# Patient Record
Sex: Male | Born: 1965 | Race: White | Hispanic: No | Marital: Married | Smoking: Never smoker
Health system: Southern US, Community
[De-identification: ages and names within clinical notes are randomized; demographics above are authoritative.]

## PROBLEM LIST (undated history)

## (undated) DIAGNOSIS — J302 Other seasonal allergic rhinitis: Secondary | ICD-10-CM

## (undated) DIAGNOSIS — E119 Type 2 diabetes mellitus without complications: Secondary | ICD-10-CM

## (undated) DIAGNOSIS — G4730 Sleep apnea, unspecified: Secondary | ICD-10-CM

## (undated) DIAGNOSIS — M1990 Unspecified osteoarthritis, unspecified site: Secondary | ICD-10-CM

## (undated) DIAGNOSIS — N189 Chronic kidney disease, unspecified: Secondary | ICD-10-CM

## (undated) HISTORY — PX: OTHER SURGICAL HISTORY: SHX169

## (undated) HISTORY — PX: WISDOM TOOTH EXTRACTION: SHX21

## (undated) HISTORY — PX: COLONOSCOPY: SHX174

## (undated) HISTORY — PX: KNEE ARTHROSCOPY: SUR90

---

## 2012-04-05 HISTORY — PX: ESOPHAGOGASTRODUODENOSCOPY ENDOSCOPY: SHX5814

## 2013-07-09 ENCOUNTER — Other Ambulatory Visit: Payer: Self-pay | Admitting: Neurosurgery

## 2013-07-09 ENCOUNTER — Other Ambulatory Visit (HOSPITAL_COMMUNITY): Payer: Self-pay | Admitting: *Deleted

## 2013-07-09 ENCOUNTER — Encounter (HOSPITAL_COMMUNITY)
Admission: RE | Admit: 2013-07-09 | Discharge: 2013-07-09 | Disposition: A | Discharge status: Home / Self Care | Payer: BC Managed Care – PPO | Source: Ambulatory Visit | Attending: Neurosurgery | Admitting: Neurosurgery

## 2013-07-09 ENCOUNTER — Encounter (HOSPITAL_COMMUNITY): Payer: Self-pay

## 2013-07-09 HISTORY — DX: Type 2 diabetes mellitus without complications: E11.9

## 2013-07-09 HISTORY — DX: Unspecified osteoarthritis, unspecified site: M19.90

## 2013-07-09 HISTORY — DX: Sleep apnea, unspecified: G47.30

## 2013-07-09 HISTORY — DX: Chronic kidney disease, unspecified: N18.9

## 2013-07-09 HISTORY — DX: Other seasonal allergic rhinitis: J30.2

## 2013-07-09 LAB — CBC
HCT: 46.7 % (ref 39.0–52.0)
HEMOGLOBIN: 16.9 g/dL (ref 13.0–17.0)
MCH: 29.3 pg (ref 26.0–34.0)
MCHC: 36.2 g/dL — ABNORMAL HIGH (ref 30.0–36.0)
MCV: 80.9 fL (ref 78.0–100.0)
Platelets: 256 10*3/uL (ref 150–400)
RBC: 5.77 MIL/uL (ref 4.22–5.81)
RDW: 13.2 % (ref 11.5–15.5)
WBC: 10.6 10*3/uL — AB (ref 4.0–10.5)

## 2013-07-09 LAB — BASIC METABOLIC PANEL
BUN: 19 mg/dL (ref 6–23)
CALCIUM: 9.8 mg/dL (ref 8.4–10.5)
CO2: 24 meq/L (ref 19–32)
Chloride: 92 mEq/L — ABNORMAL LOW (ref 96–112)
Creatinine, Ser: 0.8 mg/dL (ref 0.50–1.35)
GFR calc Af Amer: >90 mL/min (ref >90)
GLUCOSE: 264 mg/dL — AB (ref 70–99)
POTASSIUM: 4.8 meq/L (ref 3.7–5.3)
SODIUM: 133 meq/L — AB (ref 137–147)

## 2013-07-09 LAB — SURGICAL PCR SCREEN
MRSA, PCR: NEGATIVE
Staphylococcus aureus: NEGATIVE

## 2013-07-09 NOTE — Progress Notes (Signed)
Anesthesia chart review: Patient is a 48 year old male posted for one level lumbar laminectomy/decompression mic42rodiscectomy tomorrow at 0730.  His PAT appointment was earlier today.  Chart was given to me to review after 4:30 PM.  History includes DM2, OSA, nephrolithiasis, arthritis. He denied known cardiac history.  BMI is 37 consistent with obesity.  PCP is Dr. Rexene EdisonH. Carollee MassedAnthony alexander in [place].  EKG on 07/09/13 showed NSR, septal infarct (age undetermined).  Overall, probably not significantly changed when compared to his prior EKG on 10/10/12 (PCP).  Preoperative labs noted.  He will get a fasting CBG on arrival.   Further evaluation by his assigned anesthesiologist on the day of surgery for clinical correlation.  If he remains asymptomatic from a CV standpoint then I would anticipate that he could proceed as planned.  Velna Ochsllison Decker Cogdell, PA-C [facility]/Anesthesiology Phone [phone] 07/09/2013 5:08 PM

## 2013-07-09 NOTE — Pre-Procedure Instructions (Signed)
Cole Dickson  07/09/2013   Your procedure is scheduled on:  Tuesday, July 10, 2013 at 7:30 AM.   [facility] Admitting Office at 5:30 AM.   Call this number if you have problems the morning of surgery: [phone]   Remember:   Do not eat food or drink liquids after midnight tonight.   Take these medicines the morning of surgery with A SIP OF WATER: Allopurinol  Please bring your CPAP mask and tubing with you in the am.   Do not wear jewelry, make-up or nail polish.  Do not wear lotions, powders, or perfumes. You may wear deodorant.  Men may shave face and neck.  Do not bring valuables to the hospital.  [facility] is not responsible                  for any belongings or valuables.               Contacts, dentures or bridgework may not be worn into surgery.  Leave suitcase in the car. After surgery it may be brought to your room.  For patients admitted to the hospital, discharge time is determined by your                treatment team.             Special Instructions: [place] - Preparing for Surgery  Before surgery, you can play an important role.  Because skin is not sterile, your skin needs to be as free of germs as possible.  You can reduce the number of germs on you skin by washing with CHG (chlorahexidine gluconate) soap before surgery.  CHG is an antiseptic cleaner which kills germs and bonds with the skin to continue killing germs even after washing.  Please DO NOT use if you have an allergy to CHG or antibacterial soaps.  If your skin becomes reddened/irritated stop using the CHG and inform your nurse when you arrive at Short Stay.  Do not shave (including legs and underarms) for at least 48 hours prior to the first CHG shower.  You may shave your face.  Please follow these instructions carefully:   1.  Shower with CHG Soap the night before surgery and the                                morning of Surgery.  2.  If you choose to  wash your hair, wash your hair first as usual with your       normal shampoo.  3.  After you shampoo, rinse your hair and body thoroughly to remove the                      Shampoo.  4.  Use CHG as you would any other liquid soap.  You can apply chg directly       to the skin and wash gently with scrungie or a clean washcloth.  5.  Apply the CHG Soap to your body ONLY FROM THE NECK DOWN.        Do not use on open wounds or open sores.  Avoid contact with your eyes, ears, mouth and genitals (private parts).  Wash genitals (private parts) with your normal soap.  6.  Wash thoroughly, paying special attention to the area where your surgery        will  be performed.  7.  Thoroughly rinse your body with warm water from the neck down.  8.  DO NOT shower/wash with your normal soap after using and rinsing off       the CHG Soap.  9.  Pat yourself dry with a clean towel.            10.  Wear clean pajamas.            11.  Place clean sheets on your bed the night of your first shower and do not        sleep with pets.  Day of Surgery  Do not apply any lotions the morning of surgery.  Please wear clean clothes to the hospital/surgery center.     Please read over the following fact sheets that you were given: Pain Booklet, Coughing and Deep Breathing, MRSA Information and Surgical Site Infection Prevention

## 2013-07-09 NOTE — Progress Notes (Signed)
Pt's PCP is Dr. Rexene EdisonH. Carollee MassedAnthony Alexander in [place]  Pt denies chest pain, sob. States he's never had to be worked up for any cardiac issues.   Pt instructed to bring CPAP mask and tubing day of surgery.

## 2013-07-10 ENCOUNTER — Encounter (HOSPITAL_COMMUNITY)
Admission: RE | Disposition: A | Discharge status: Home / Self Care | Payer: Self-pay | Source: Ambulatory Visit | Attending: Neurosurgery

## 2013-07-10 ENCOUNTER — Ambulatory Visit (HOSPITAL_COMMUNITY): Payer: BC Managed Care – PPO | Admitting: Certified Registered Nurse Anesthetist

## 2013-07-10 ENCOUNTER — Encounter (HOSPITAL_COMMUNITY): Payer: BC Managed Care – PPO | Admitting: Vascular Surgery

## 2013-07-10 ENCOUNTER — Ambulatory Visit (HOSPITAL_COMMUNITY): Payer: BC Managed Care – PPO

## 2013-07-10 ENCOUNTER — Encounter (HOSPITAL_COMMUNITY): Payer: Self-pay | Admitting: Certified Registered Nurse Anesthetist

## 2013-07-10 ENCOUNTER — Ambulatory Visit (HOSPITAL_COMMUNITY)
Admission: RE | Admit: 2013-07-10 | Discharge: 2013-07-10 | Disposition: A | Discharge status: Home / Self Care | Payer: BC Managed Care – PPO | Source: Ambulatory Visit | Attending: Neurosurgery | Admitting: Neurosurgery

## 2013-07-10 DIAGNOSIS — Z01810 Encounter for preprocedural cardiovascular examination: Secondary | ICD-10-CM | POA: Insufficient documentation

## 2013-07-10 DIAGNOSIS — Z01812 Encounter for preprocedural laboratory examination: Secondary | ICD-10-CM | POA: Insufficient documentation

## 2013-07-10 DIAGNOSIS — M129 Arthropathy, unspecified: Secondary | ICD-10-CM | POA: Insufficient documentation

## 2013-07-10 DIAGNOSIS — J301 Allergic rhinitis due to pollen: Secondary | ICD-10-CM | POA: Insufficient documentation

## 2013-07-10 DIAGNOSIS — G4730 Sleep apnea, unspecified: Secondary | ICD-10-CM | POA: Insufficient documentation

## 2013-07-10 DIAGNOSIS — M5126 Other intervertebral disc displacement, lumbar region: Secondary | ICD-10-CM | POA: Diagnosis present

## 2013-07-10 DIAGNOSIS — E119 Type 2 diabetes mellitus without complications: Secondary | ICD-10-CM | POA: Insufficient documentation

## 2013-07-10 DIAGNOSIS — Z87442 Personal history of urinary calculi: Secondary | ICD-10-CM | POA: Insufficient documentation

## 2013-07-10 HISTORY — PX: LUMBAR LAMINECTOMY/DECOMPRESSION MICRODISCECTOMY: SHX5026

## 2013-07-10 LAB — GLUCOSE, CAPILLARY
GLUCOSE-CAPILLARY: 270 mg/dL — AB (ref 70–99)
GLUCOSE-CAPILLARY: 196 mg/dL — AB (ref 70–99)
GLUCOSE-CAPILLARY: 314 mg/dL — AB (ref 70–99)
GLUCOSE-CAPILLARY: 342 mg/dL — AB (ref 70–99)
Glucose-Capillary: 395 mg/dL — ABNORMAL HIGH (ref 70–99)
Glucose-Capillary: 211 mg/dL — ABNORMAL HIGH (ref 70–99)
Glucose-Capillary: 201 mg/dL — ABNORMAL HIGH (ref 70–99)

## 2013-07-10 SURGERY — LUMBAR LAMINECTOMY/DECOMPRESSION MICRODISCECTOMY 1 LEVEL
Anesthesia: General | Site: Back | Laterality: Left

## 2013-07-10 MED ORDER — INSULIN ASPART 100 UNIT/ML ~~LOC~~ SOLN
SUBCUTANEOUS | Status: AC
  Filled 2013-07-10: qty 1

## 2013-07-10 MED ORDER — ALLOPURINOL 300 MG PO TABS
300.0000 mg | ORAL_TABLET | Freq: Every day | ORAL | Status: DC
  Administered 2013-07-10: 300 mg via ORAL
  Filled 2013-07-10: qty 1

## 2013-07-10 MED ORDER — ROCURONIUM BROMIDE 100 MG/10ML IV SOLN
INTRAVENOUS | Status: DC | PRN
  Administered 2013-07-10 (×2): 50 mg via INTRAVENOUS

## 2013-07-10 MED ORDER — GLYCOPYRROLATE 0.2 MG/ML IJ SOLN
INTRAMUSCULAR | Status: AC
  Filled 2013-07-10: qty 4

## 2013-07-10 MED ORDER — PROPOFOL 10 MG/ML IV BOLUS
INTRAVENOUS | Status: AC
  Filled 2013-07-10: qty 20

## 2013-07-10 MED ORDER — SODIUM CHLORIDE 0.9 % IR SOLN
Status: DC | PRN
  Administered 2013-07-10: 07:00:00

## 2013-07-10 MED ORDER — NEOSTIGMINE METHYLSULFATE 1 MG/ML IJ SOLN
INTRAMUSCULAR | Status: DC | PRN
  Administered 2013-07-10: 5 mg via INTRAVENOUS

## 2013-07-10 MED ORDER — LIDOCAINE HCL (CARDIAC) 20 MG/ML IV SOLN
INTRAVENOUS | Status: AC
Start: 2013-07-10 — End: 2013-07-10
  Filled 2013-07-10: qty 5

## 2013-07-10 MED ORDER — SODIUM CHLORIDE 0.9 % IV SOLN: 250.0000 mL | INTRAVENOUS | Status: DC

## 2013-07-10 MED ORDER — HYDROMORPHONE HCL PF 1 MG/ML IJ SOLN
INTRAMUSCULAR | Status: AC
  Administered 2013-07-10: 0.25 mg via INTRAVENOUS
  Filled 2013-07-10: qty 1

## 2013-07-10 MED ORDER — HEMOSTATIC AGENTS (NO CHARGE) OPTIME
TOPICAL | Status: DC | PRN
  Administered 2013-07-10: 1 via TOPICAL

## 2013-07-10 MED ORDER — LISINOPRIL 10 MG PO TABS
10.0000 mg | ORAL_TABLET | Freq: Every day | ORAL | Status: DC
  Administered 2013-07-10: 10 mg via ORAL
  Filled 2013-07-10: qty 1

## 2013-07-10 MED ORDER — THROMBIN 5000 UNITS EX SOLR
CUTANEOUS | Status: DC | PRN
  Administered 2013-07-10 (×2): 5000 [IU] via TOPICAL

## 2013-07-10 MED ORDER — FENTANYL CITRATE 0.05 MG/ML IJ SOLN
INTRAMUSCULAR | Status: AC
  Filled 2013-07-10: qty 5

## 2013-07-10 MED ORDER — LIDOCAINE HCL (CARDIAC) 20 MG/ML IV SOLN
INTRAVENOUS | Status: DC | PRN
  Administered 2013-07-10: 40 mg via INTRAVENOUS

## 2013-07-10 MED ORDER — ONDANSETRON HCL 4 MG/2ML IJ SOLN
4.0000 mg | INTRAMUSCULAR | Status: DC | PRN
Start: 2013-07-10 — End: 2013-07-10

## 2013-07-10 MED ORDER — ONDANSETRON HCL 4 MG/2ML IJ SOLN: 4.0000 mg | Freq: Once | INTRAMUSCULAR | Status: DC | PRN

## 2013-07-10 MED ORDER — CYCLOBENZAPRINE HCL 10 MG PO TABS: 10.0000 mg | ORAL_TABLET | Freq: Three times a day (TID) | ORAL | Status: DC | PRN

## 2013-07-10 MED ORDER — MIDAZOLAM HCL 5 MG/5ML IJ SOLN
INTRAMUSCULAR | Status: DC | PRN
  Administered 2013-07-10: 2 mg via INTRAVENOUS

## 2013-07-10 MED ORDER — MENTHOL 3 MG MT LOZG: 1.0000 | LOZENGE | OROMUCOSAL | Status: DC | PRN

## 2013-07-10 MED ORDER — VANCOMYCIN HCL IN DEXTROSE 1-5 GM/200ML-% IV SOLN
1000.0000 mg | Freq: Once | INTRAVENOUS | Status: DC
  Filled 2013-07-10: qty 200

## 2013-07-10 MED ORDER — FENTANYL CITRATE 0.05 MG/ML IJ SOLN
INTRAMUSCULAR | Status: DC | PRN
  Administered 2013-07-10 (×2): 100 ug via INTRAVENOUS
  Administered 2013-07-10: 150 ug via INTRAVENOUS

## 2013-07-10 MED ORDER — KETOROLAC TROMETHAMINE 30 MG/ML IJ SOLN
INTRAMUSCULAR | Status: DC | PRN
  Administered 2013-07-10: 30 mg via INTRAVENOUS

## 2013-07-10 MED ORDER — HYDROCODONE-ACETAMINOPHEN 5-325 MG PO TABS: 1.0000 | ORAL_TABLET | ORAL | Status: AC | PRN

## 2013-07-10 MED ORDER — SODIUM CHLORIDE 0.9 % IV SOLN
100.0000 [IU] | INTRAVENOUS | Status: DC | PRN
  Administered 2013-07-10: 1 [IU]/h via INTRAVENOUS

## 2013-07-10 MED ORDER — POTASSIUM CHLORIDE IN NACL 20-0.45 MEQ/L-% IV SOLN
INTRAVENOUS | Status: DC
  Filled 2013-07-10 (×2): qty 1000

## 2013-07-10 MED ORDER — NEOSTIGMINE METHYLSULFATE 1 MG/ML IJ SOLN
INTRAMUSCULAR | Status: AC
  Filled 2013-07-10: qty 10

## 2013-07-10 MED ORDER — BUPIVACAINE HCL (PF) 0.5 % IJ SOLN
INTRAMUSCULAR | Status: DC | PRN
  Administered 2013-07-10: 20 mL

## 2013-07-10 MED ORDER — KETOROLAC TROMETHAMINE 30 MG/ML IJ SOLN
INTRAMUSCULAR | Status: AC
  Filled 2013-07-10: qty 1

## 2013-07-10 MED ORDER — SODIUM CHLORIDE 0.9 % IJ SOLN: 3.0000 mL | Freq: Two times a day (BID) | INTRAMUSCULAR | Status: DC

## 2013-07-10 MED ORDER — PHENOL 1.4 % MT LIQD: 1.0000 | OROMUCOSAL | Status: DC | PRN

## 2013-07-10 MED ORDER — VANCOMYCIN HCL IN DEXTROSE 1-5 GM/200ML-% IV SOLN
INTRAVENOUS | Status: AC
  Administered 2013-07-10: 1000 mg via INTRAVENOUS
  Filled 2013-07-10: qty 200

## 2013-07-10 MED ORDER — INSULIN ASPART 100 UNIT/ML ~~LOC~~ SOLN
6.0000 [IU] | Freq: Three times a day (TID) | SUBCUTANEOUS | Status: DC
  Administered 2013-07-10: 6 [IU] via SUBCUTANEOUS

## 2013-07-10 MED ORDER — ALUM & MAG HYDROXIDE-SIMETH 200-200-20 MG/5ML PO SUSP: 30.0000 mL | Freq: Four times a day (QID) | ORAL | Status: DC | PRN

## 2013-07-10 MED ORDER — HYDROCODONE-ACETAMINOPHEN 5-325 MG PO TABS: 1.0000 | ORAL_TABLET | ORAL | Status: DC | PRN

## 2013-07-10 MED ORDER — ONDANSETRON HCL 4 MG/2ML IJ SOLN
INTRAMUSCULAR | Status: AC
  Filled 2013-07-10: qty 2

## 2013-07-10 MED ORDER — HYDROMORPHONE HCL PF 1 MG/ML IJ SOLN: 1.0000 mg | INTRAMUSCULAR | Status: DC | PRN

## 2013-07-10 MED ORDER — INSULIN ASPART 100 UNIT/ML ~~LOC~~ SOLN
0.0000 [IU] | Freq: Every day | SUBCUTANEOUS | Status: DC
Start: 2013-07-10 — End: 2013-07-10

## 2013-07-10 MED ORDER — PROPOFOL 10 MG/ML IV BOLUS
INTRAVENOUS | Status: DC | PRN
  Administered 2013-07-10: 180 mg via INTRAVENOUS

## 2013-07-10 MED ORDER — LINAGLIPTIN 5 MG PO TABS
5.0000 mg | ORAL_TABLET | Freq: Every day | ORAL | Status: DC
  Filled 2013-07-10: qty 1

## 2013-07-10 MED ORDER — HYDROMORPHONE HCL PF 1 MG/ML IJ SOLN
0.2500 mg | INTRAMUSCULAR | Status: DC | PRN
  Administered 2013-07-10 (×2): 0.25 mg via INTRAVENOUS

## 2013-07-10 MED ORDER — ACETAMINOPHEN 325 MG PO TABS: 650.0000 mg | ORAL_TABLET | ORAL | Status: DC | PRN

## 2013-07-10 MED ORDER — METFORMIN HCL 500 MG PO TABS
500.0000 mg | ORAL_TABLET | Freq: Two times a day (BID) | ORAL | Status: DC
  Filled 2013-07-10: qty 1

## 2013-07-10 MED ORDER — PANTOPRAZOLE SODIUM 40 MG IV SOLR
40.0000 mg | Freq: Every day | INTRAVENOUS | Status: DC
  Filled 2013-07-10: qty 40

## 2013-07-10 MED ORDER — SODIUM CHLORIDE 0.9 % IV SOLN
INTRAVENOUS | Status: DC
  Filled 2013-07-10: qty 1

## 2013-07-10 MED ORDER — LACTATED RINGERS IV SOLN
INTRAVENOUS | Status: DC | PRN
  Administered 2013-07-10 (×2): via INTRAVENOUS

## 2013-07-10 MED ORDER — DEXAMETHASONE SODIUM PHOSPHATE 10 MG/ML IJ SOLN: 10.0000 mg | INTRAMUSCULAR | Status: DC

## 2013-07-10 MED ORDER — KETOROLAC TROMETHAMINE 30 MG/ML IJ SOLN
30.0000 mg | Freq: Four times a day (QID) | INTRAMUSCULAR | Status: DC
  Administered 2013-07-10: 30 mg via INTRAVENOUS
  Filled 2013-07-10: qty 1

## 2013-07-10 MED ORDER — GLYCOPYRROLATE 0.2 MG/ML IJ SOLN
INTRAMUSCULAR | Status: DC | PRN
  Administered 2013-07-10: .8 mg via INTRAVENOUS

## 2013-07-10 MED ORDER — CEFAZOLIN SODIUM-DEXTROSE 2-3 GM-% IV SOLR: 2.0000 g | INTRAVENOUS | Status: DC

## 2013-07-10 MED ORDER — ROCURONIUM BROMIDE 50 MG/5ML IV SOLN
INTRAVENOUS | Status: AC
  Filled 2013-07-10: qty 1

## 2013-07-10 MED ORDER — INSULIN ASPART 100 UNIT/ML ~~LOC~~ SOLN
0.0000 [IU] | Freq: Three times a day (TID) | SUBCUTANEOUS | Status: DC
Start: 2013-07-10 — End: 2013-07-10
  Administered 2013-07-10: 15 [IU] via SUBCUTANEOUS
  Administered 2013-07-10: 4 [IU] via SUBCUTANEOUS
  Filled 2013-07-10 (×25): qty 0.2

## 2013-07-10 MED ORDER — ONDANSETRON HCL 4 MG/2ML IJ SOLN
INTRAMUSCULAR | Status: DC | PRN
  Administered 2013-07-10: 4 mg via INTRAVENOUS

## 2013-07-10 MED ORDER — 0.9 % SODIUM CHLORIDE (POUR BTL) OPTIME
TOPICAL | Status: DC | PRN
  Administered 2013-07-10: 1000 mL

## 2013-07-10 MED ORDER — SITAGLIPTIN PHOS-METFORMIN HCL 50-500 MG PO TABS: 1.0000 | ORAL_TABLET | Freq: Two times a day (BID) | ORAL | Status: DC

## 2013-07-10 MED ORDER — DEXAMETHASONE SODIUM PHOSPHATE 10 MG/ML IJ SOLN
INTRAMUSCULAR | Status: AC
  Filled 2013-07-10: qty 1

## 2013-07-10 MED ORDER — INSULIN ASPART 100 UNIT/ML ~~LOC~~ SOLN
10.0000 [IU] | Freq: Once | SUBCUTANEOUS | Status: AC
  Administered 2013-07-10: 07:00:00 via SUBCUTANEOUS

## 2013-07-10 MED ORDER — MIDAZOLAM HCL 2 MG/2ML IJ SOLN
INTRAMUSCULAR | Status: AC
  Filled 2013-07-10: qty 2

## 2013-07-10 MED ORDER — ACETAMINOPHEN 650 MG RE SUPP: 650.0000 mg | RECTAL | Status: DC | PRN

## 2013-07-10 MED ORDER — SODIUM CHLORIDE 0.9 % IJ SOLN: 3.0000 mL | INTRAMUSCULAR | Status: DC | PRN

## 2013-07-10 SURGICAL SUPPLY — 48 items
BAG DECANTER FOR FLEXI CONT (MISCELLANEOUS) ×3 IMPLANT
BENZOIN TINCTURE PRP APPL 2/3 (GAUZE/BANDAGES/DRESSINGS) ×3 IMPLANT
BLADE SURG ROTATE 9660 (MISCELLANEOUS) ×3 IMPLANT
BRUSH SCRUB EZ PLAIN DRY (MISCELLANEOUS) ×3 IMPLANT
BUR CUTTER 7.0 ROUND (BURR) ×3 IMPLANT
BUR MATCHSTICK NEURO 3.0 LAGG (BURR) IMPLANT
CANISTER SUCT 3000ML (MISCELLANEOUS) ×3 IMPLANT
CLOSURE WOUND 1/2 X4 (GAUZE/BANDAGES/DRESSINGS) ×1
CONT SPEC 4OZ CLIKSEAL STRL BL (MISCELLANEOUS) ×3 IMPLANT
DERMABOND ADVANCED (GAUZE/BANDAGES/DRESSINGS) ×2
DERMABOND ADVANCED .7 DNX12 (GAUZE/BANDAGES/DRESSINGS) ×1 IMPLANT
DRAPE LAPAROTOMY 100X72X124 (DRAPES) ×3 IMPLANT
DRAPE MICROSCOPE LEICA (MISCELLANEOUS) ×3 IMPLANT
DRAPE MICROSCOPE ZEISS OPMI (DRAPES) IMPLANT
DRAPE SURG 17X23 STRL (DRAPES) ×6 IMPLANT
DRESSING TELFA 8X3 (GAUZE/BANDAGES/DRESSINGS) IMPLANT
DRSG OPSITE POSTOP 4X6 (GAUZE/BANDAGES/DRESSINGS) ×3 IMPLANT
DURAPREP 26ML APPLICATOR (WOUND CARE) ×3 IMPLANT
ELECT REM PT RETURN 9FT ADLT (ELECTROSURGICAL) ×3
ELECTRODE REM PT RTRN 9FT ADLT (ELECTROSURGICAL) ×1 IMPLANT
GAUZE SPONGE 4X4 16PLY XRAY LF (GAUZE/BANDAGES/DRESSINGS) IMPLANT
GLOVE BIO SURGEON STRL SZ7.5 (GLOVE) ×9 IMPLANT
GLOVE BIOGEL M 8.0 STRL (GLOVE) ×3 IMPLANT
GLOVE ECLIPSE 8.0 STRL XLNG CF (GLOVE) ×6 IMPLANT
GLOVE INDICATOR 7.5 STRL GRN (GLOVE) ×3 IMPLANT
GOWN BRE IMP SLV AUR LG STRL (GOWN DISPOSABLE) IMPLANT
GOWN BRE IMP SLV AUR XL STRL (GOWN DISPOSABLE) IMPLANT
GOWN STRL REIN 2XL LVL4 (GOWN DISPOSABLE) IMPLANT
GOWN STRL REUS W/ TWL LRG LVL3 (GOWN DISPOSABLE) ×3 IMPLANT
GOWN STRL REUS W/TWL LRG LVL3 (GOWN DISPOSABLE) ×6
KIT BASIN OR (CUSTOM PROCEDURE TRAY) ×3 IMPLANT
KIT ROOM TURNOVER OR (KITS) ×3 IMPLANT
NEEDLE HYPO 22GX1.5 SAFETY (NEEDLE) ×3 IMPLANT
NEEDLE SPNL 22GX3.5 QUINCKE BK (NEEDLE) ×6 IMPLANT
NS IRRIG 1000ML POUR BTL (IV SOLUTION) ×3 IMPLANT
PACK LAMINECTOMY NEURO (CUSTOM PROCEDURE TRAY) ×3 IMPLANT
PAD ARMBOARD 7.5X6 YLW CONV (MISCELLANEOUS) ×9 IMPLANT
PATTIES SURGICAL .75X.75 (GAUZE/BANDAGES/DRESSINGS) ×3 IMPLANT
RUBBERBAND STERILE (MISCELLANEOUS) ×6 IMPLANT
SPONGE GAUZE 4X4 12PLY (GAUZE/BANDAGES/DRESSINGS) ×3 IMPLANT
SPONGE SURGIFOAM ABS GEL SZ50 (HEMOSTASIS) ×3 IMPLANT
STRIP CLOSURE SKIN 1/2X4 (GAUZE/BANDAGES/DRESSINGS) ×2 IMPLANT
SUT VIC AB 2-0 OS6 18 (SUTURE) ×9 IMPLANT
SUT VIC AB 3-0 CP2 18 (SUTURE) ×3 IMPLANT
SYR 20ML ECCENTRIC (SYRINGE) ×3 IMPLANT
TOWEL OR 17X24 6PK STRL BLUE (TOWEL DISPOSABLE) ×3 IMPLANT
TOWEL OR 17X26 10 PK STRL BLUE (TOWEL DISPOSABLE) ×3 IMPLANT
WATER STERILE IRR 1000ML POUR (IV SOLUTION) ×3 IMPLANT

## 2013-07-10 NOTE — Progress Notes (Addendum)
ANTIBIOTIC CONSULT NOTE - INITIAL  Pharmacy Consult for vancomycin Indication: surgical prophylaxis  Allergies  Allergen Reactions  . Amcap [Ampicillin] Anaphylaxis and Hives    Patient Measurements:   Adjusted Body Weight:   Vital Signs: Temp: 98.1 F (36.7 C) (04/07 1153) BP: 125/73 mmHg (04/07 1153) Pulse Rate: 88 (04/07 1153) Intake/Output from previous day:   Intake/Output from this shift: Total I/O In: 1240 [P.O.:240; I.V.:1000] Out: 50 [Blood:50]  Labs:  Recent Labs  07/09/13 1419  WBC 10.6*  HGB 16.9  PLT 256  CREATININE 0.80   The CrCl is unknown because both a height and weight (above a minimum accepted value) are required for this calculation. No results found for this basename: VANCOTROUGH, Leodis BinetVANCOPEAK, VANCORANDOM, GENTTROUGH, GENTPEAK, GENTRANDOM, TOBRATROUGH, TOBRAPEAK, TOBRARND, AMIKACINPEAK, AMIKACINTROU, AMIKACIN,  in the last 72 hours   Microbiology: Recent Results (from the past 720 hour(s))  SURGICAL PCR SCREEN     Status: None   Collection Time    07/09/13  2:19 PM      Result Value Ref Range Status   MRSA, PCR NEGATIVE  NEGATIVE Final   Staphylococcus aureus NEGATIVE  NEGATIVE Final   Comment:            The Xpert SA Assay (FDA     approved for NASAL specimens     in patients over 50 years of age),     is one component of     a comprehensive surveillance     program.  Test performance has     been validated by [facility] for patients greater     than or equal to 473 year old.     It is not intended     to diagnose infection nor to     guide or monitor treatment.    Medical History: Past Medical History  Diagnosis Date  . Sleep apnea      uses cpap  . Diabetes mellitus without complication     takes Janumet  . Seasonal allergies   . Chronic kidney disease     kidney stones  . Arthritis     Medications:  Scheduled:  . allopurinol  300 mg Oral Daily  . dexamethasone      . insulin aspart      . insulin aspart  0-20  Units Subcutaneous TID WC  . insulin aspart  0-5 Units Subcutaneous QHS  . insulin aspart  6 Units Subcutaneous TID WC  . ketorolac  30 mg Intravenous 4 times per day  . [START ON 07/11/2013] linagliptin  5 mg Oral Daily   And  . [START ON 07/11/2013] metFORMIN  500 mg Oral BID WC  . lisinopril  10 mg Oral Daily  . pantoprazole (PROTONIX) IV  40 mg Intravenous QHS  . sodium chloride  3 mL Intravenous Q12H   Infusions:  . 0.45 % NaCl with KCl 20 mEq / L    . sodium chloride     Assessment: 48 yo male s/p neurosurgery will be put on vancomycin for surgical prophylaxis.  Patient had one dose of vancomycin 1g iv x1 at 0725 on 07/10/13.  SCr on 04/06 was 0.8. Per RN, patient doesn't have a drain  Goal of Therapy:  Vancomycin trough level 15-20 mcg/ml  Plan:  1) Vancomycin 1g iv x1 at 1930 tonight.  Pharmacy will sign off  Sandford Diop, Tsz-Yin 07/10/2013,12:41 PM

## 2013-07-10 NOTE — Progress Notes (Signed)
Pt doing well. Pt and wife given D/C instructions with Rx, verbal understanding of teaching was given. Pt's IV was D/C'd prior to D/C. Pt D/C'd home via wheelchair @ 1835 per MD order. Pt was stable @ D/C and had no other needs. Rema FendtAshley Braylyn Kalter, RN

## 2013-07-10 NOTE — Op Note (Signed)
Preop diagnosis: Herniated disc L5-S1 left Postop diagnosis: Same Procedure: Left L5-S1 intralaminar laminotomy for excision of herniated disc with operating microscope Surgeon: Insurance risk surveyorKritzer Assistant: [place]  After being placed the prone position the patient's back was prepped and draped in usual sterile fashion. Localizing x-ray was taken prior to incision to identify the appropriate level. Midline incision was made above the spinous processes of L5 and S1. Using Bovie cutting current the incision was carried on the spinous processes. Subperiosteal dissection was then carried out along the left-sided spinous processes and lamina and self-retaining tract was placed for exposure. X-ray showed approach the appropriate level. Using the high-speed drill the inferior one third of the L5 lamina the medial one third of the facet joint the superior one third of the S1 lamina were removed. Residual bone and ligamentum flavum removed in a piecemeal fashion. The microscope was draped brought into the field and used for the remainder of the case. Using microdissection technique the lateral aspect of the thecal sac and S1 nerve root were identified. Further coagulation was carried out down before the canal to identify the L5-S1 disc which is done remarkably herniated. After coagulating on the annulus then was incised a 15 blade. Using pituitary rongeurs and curettes aggressive disc space cleanout was carried out while the same time great care was taken to avoid injury to the neural elements and this was successfully done. Calcified annulus the midline was hammered back and the disc space and then removed to help with the decompression. At this time inspection was carried out in all directions for any evidence of residual compression and none could be identified. Irrigation was carried out and any bleeding control proper coagulation Gelfoam. The wounds and closed in multiple layers of Vicryl on the muscle fascia subcutaneous  and subcuticular tissues. Dermabond Steri-Strips were placed on the skin. Shortness was then applied the patient was extubated taken to recovery in stable condition.

## 2013-07-10 NOTE — Anesthesia Procedure Notes (Addendum)
Procedure Name: Intubation Date/Time: 07/10/2013 7:40 AM Performed by: Reine JustFLOWERS, Ashanty Coltrane T Pre-anesthesia Checklist: Patient identified, Timeout performed, Emergency Drugs available, Suction available and Patient being monitored Patient Re-evaluated:Patient Re-evaluated prior to inductionOxygen Delivery Method: Circle system utilized and Simple face mask Preoxygenation: Pre-oxygenation with 100% oxygen Intubation Type: IV induction Ventilation: Mask ventilation with difficulty, Two handed mask ventilation required and Oral airway inserted - appropriate to patient size Laryngoscope Size: Mac and 4 Grade View: Grade II Tube type: Oral Tube size: 7.5 mm Number of attempts: 1 Airway Equipment and Method: Patient positioned with wedge pillow and Stylet Placement Confirmation: ETT inserted through vocal cords under direct vision,  positive ETCO2 and breath sounds checked- equal and bilateral Secured at: 22 cm Tube secured with: Tape Dental Injury: Teeth and Oropharynx as per pre-operative assessment    Performed by: Reine JustFLOWERS, Donnie Gedeon T

## 2013-07-10 NOTE — Discharge Summary (Signed)
  Physician Discharge Summary  Patient ID: Cole Dickson MRN: [id number] DOB/AGE: 50-Jun-1967 50 y.o.  Admit date: 07/10/2013 Discharge date: 07/10/2013  Admission Diagnoses:  Discharge Diagnoses:  Active Problems:   Lumbar disc herniation   Discharged Condition: good  Hospital Course: Surgery this morning for herniated disc. Did well. No pain post op. Ambulated well. Home same day, specific instructiions given.  Consults: None  Significant Diagnostic Studies: none  Treatments: surgery: Left L5S1 discectomy  Discharge Exam: Blood pressure 132/67, pulse 92, temperature 98.3 F (36.8 C), resp. rate 18, SpO2 92.00%. Incision/Wound:clean and dry; no new neuro issues  Disposition: Final discharge disposition not confirmed     Medication List    ASK your doctor about these medications       allopurinol 300 MG tablet  Commonly known as:  ZYLOPRIM  Take 300 mg by mouth daily.     desloratadine-pseudoephedrine 5-240 MG per 24 hr tablet  Commonly known as:  CLARINEX-D 24-hour  Take 1 tablet by mouth daily as needed (allergies).     lisinopril 10 MG tablet  Commonly known as:  PRINIVIL,ZESTRIL  Take 10 mg by mouth daily.     multivitamin with minerals tablet  Take 1 tablet by mouth daily.     sitaGLIPtin-metformin 50-500 MG per tablet  Commonly known as:  JANUMET  Take 1 tablet by mouth 2 (two) times daily with a meal.         At home rest most of the time. Get up 9 or 10 times each day and take a 15 or 20 minute walk. No riding in the car and to your first postoperative appointment. If you have neck surgery you may shower from the chest down starting on the third postoperative day. If you had back surgery he may start showering on the third postoperative day with saran wrap wrapped around your incisional area 3 times. After the shower remove the saran wrap. Take pain medicine as needed and other medications as instructed. Call my office for an  appointment.  SignedReinaldo Meeker: Jovonte Commins O, MD 07/10/2013, 5:38 PM

## 2013-07-10 NOTE — Progress Notes (Signed)
Dr Gerlene FeeKritzer paged, but has not responded at this time to inform him of pts allergy to ancef.   CBG 395.  Dr Noreene LarssonJoslin notified and order received and given.

## 2013-07-10 NOTE — Anesthesia Postprocedure Evaluation (Signed)
  Anesthesia Post-op Note  Patient: Cole Dickson  Procedure(s) Performed: Procedure(s): LUMBAR LAMINECTOMY/DECOMPRESSION MICRODISCECTOMY LEFT  LUMBAR FIVE SACRAL- ONE (Left)  Patient Location: PACU  Anesthesia Type:General  Level of Consciousness: awake, alert  and oriented  Airway and Oxygen Therapy: Patient Spontanous Breathing  Post-op Pain: mild  Post-op Assessment: Post-op Vital signs reviewed, Patient's Cardiovascular Status Stable, Respiratory Function Stable, Patent Airway, No signs of Nausea or vomiting and Pain level controlled  Post-op Vital Signs: stable  Complications: No apparent anesthesia complications

## 2013-07-10 NOTE — Transfer of Care (Signed)
Immediate Anesthesia Transfer of Care Note  Patient: Cole Dickson  Procedure(s) Performed: Procedure(s): LUMBAR LAMINECTOMY/DECOMPRESSION MICRODISCECTOMY LEFT  LUMBAR FIVE SACRAL- ONE (Left)  Patient Location: PACU  Anesthesia Type:General  Level of Consciousness: awake and alert   Airway & Oxygen Therapy: Patient Spontanous Breathing and Patient connected to face mask oxygen  Post-op Assessment: Report given to PACU RN, Post -op Vital signs reviewed and stable and Patient moving all extremities X 4  Post vital signs: Reviewed and stable  Complications: No apparent anesthesia complications

## 2013-07-10 NOTE — H&P (Signed)
Cole Clyda GreenerShawn Force is an 48 y.o. male.   Chief Complaint: Left leg pain HPI: The patient is a 48 year old gentleman who was evaluated in the office for left leg pain of 4 months duration. There is no sudden event after few days he mentioned to his primary medical doctor. He was given prednisone without relief. He went orthopedic Dr. got an MRI scan. He had 2 epidural shots which gave no help. He saw a chiropractor and no relief was obtained. He therefore came requested a neurosurgical opinion. After evaluation the office his MRI scan was reviewed. The showed a disc herniation at L5-S1 left with S1 nerve root compression. After discussing the options the patient requested surgery now comes for a left L5-S1 microdiscectomy. I have had a long discussion with him regarding the risks and benefits of surgical intervention. The risks discussed include but are not limited to bleeding infection weakness numbness paralysis spinal fluid leak coma and death. We have discussed alternative methods of therapy although risks and benefits of nonintervention. He's had the opportunity to ask numerous questions and appears to understand. With this information in hand he has requested we proceed with surgery.  Past Medical History  Diagnosis Date  . Sleep apnea      uses cpap  . Diabetes mellitus without complication     takes Janumet  . Seasonal allergies   . Chronic kidney disease     kidney stones  . Arthritis     Past Surgical History  Procedure Laterality Date  . Pilonadal cyst  ?1993  . Knee arthroscopy Right     x 3  . Knee cartilage transplant Right   . Wisdom tooth extraction    . Colonoscopy    . Esophagogastroduodenoscopy endoscopy  2014    Family History  Problem Relation Age of Onset  . Adopted: Yes   Social History:  reports that he has never smoked. He has quit using smokeless tobacco. His smokeless tobacco use included Chew. He reports that he drinks alcohol. He reports that he does not use  illicit drugs.  Allergies:  Allergies  Allergen Reactions  . Amcap [Ampicillin] Anaphylaxis and Hives    Medications Prior to Admission  Medication Sig Dispense Refill  . allopurinol (ZYLOPRIM) 300 MG tablet Take 300 mg by mouth daily.      Marland Kitchen. desloratadine-pseudoephedrine (CLARINEX-D 24-HOUR) 5-240 MG per 24 hr tablet Take 1 tablet by mouth daily as needed (allergies).      Marland Kitchen. lisinopril (PRINIVIL,ZESTRIL) 10 MG tablet Take 10 mg by mouth daily.      . Multiple Vitamins-Minerals (MULTIVITAMIN WITH MINERALS) tablet Take 1 tablet by mouth daily.      . sitaGLIPtin-metformin (JANUMET) 50-500 MG per tablet Take 1 tablet by mouth 2 (two) times daily with a meal.        Results for orders placed during the hospital encounter of 07/10/13 (from the past 48 hour(s))  GLUCOSE, CAPILLARY     Status: Abnormal   Collection Time    07/10/13  6:35 AM      Result Value Ref Range   Glucose-Capillary 395 (*) 70 - 99 mg/dL   Comment 1 Documented in Chart     Comment 2 Notify RN     No results found.  Negative  Blood pressure 133/63, pulse 87, temperature 98.1 F (36.7 C), resp. rate 16, SpO2 95.00%.  The patient is awake alert and oriented. His no facial asymmetry. His strength is 5 over 5 in all groups tested  and sensation is intact. He has a decreased left ankle jerk reflex Assessment/Plan Impression is that of a herniated disc L5-S1 on the left. The plan is for a left L5-S1 microdiscectomy.  Reinaldo Meeker, MD 07/10/2013, 7:22 AM

## 2013-07-10 NOTE — Anesthesia Preprocedure Evaluation (Addendum)
Anesthesia Evaluation  Patient identified by MRN, date of birth, ID band Patient awake    Reviewed: Allergy & Precautions, H&P , NPO status , Patient's Chart, lab work & pertinent test results  Airway Mallampati: III TM Distance: >3 FB Neck ROM: Full    Dental  (+) Teeth Intact, Dental Advisory Given   Pulmonary  breath sounds clear to auscultation        Cardiovascular Rhythm:Regular Rate:Normal     Neuro/Psych    GI/Hepatic   Endo/Other  diabetes  Renal/GU      Musculoskeletal   Abdominal (+) + obese,   Peds  Hematology   Anesthesia Other Findings   Reproductive/Obstetrics                          Anesthesia Physical Anesthesia Plan  ASA: III  Anesthesia Plan: General   Post-op Pain Management:    Induction: Intravenous  Airway Management Planned: Oral ETT  Additional Equipment:   Intra-op Plan:   Post-operative Plan: Extubation in OR  Informed Consent: I have reviewed the patients History and Physical, chart, labs and discussed the procedure including the risks, benefits and alternatives for the proposed anesthesia with the patient or authorized representative who has indicated his/her understanding and acceptance.   Dental advisory given  Plan Discussed with: CRNA and Anesthesiologist  Anesthesia Plan Comments: (HNP L5-S1  Type 2 DM glucose 395, pt given 10 U SubQ novolog in Short Stay, will start glucomander intr-op Sleep apnea on CPAP Obesity  Plan GA with oral ETT  Kipp Broodavid Mattheus Rauls< MD)       Anesthesia Quick Evaluation

## 2013-07-11 ENCOUNTER — Encounter (HOSPITAL_COMMUNITY): Payer: Self-pay | Admitting: Neurosurgery
